# Patient Record
Sex: Male | Born: 1985 | Race: White | Hispanic: No | Marital: Single | Smoking: Never smoker
Health system: Southern US, Community
[De-identification: ages and names within clinical notes are randomized; demographics above are authoritative.]

## PROBLEM LIST (undated history)

## (undated) DIAGNOSIS — F32A Depression, unspecified: Secondary | ICD-10-CM

## (undated) DIAGNOSIS — F419 Anxiety disorder, unspecified: Secondary | ICD-10-CM

## (undated) DIAGNOSIS — F329 Major depressive disorder, single episode, unspecified: Secondary | ICD-10-CM

## (undated) HISTORY — DX: Major depressive disorder, single episode, unspecified: F32.9

## (undated) HISTORY — DX: Depression, unspecified: F32.A

## (undated) HISTORY — DX: Anxiety disorder, unspecified: F41.9

---

## 2013-12-29 ENCOUNTER — Ambulatory Visit (INDEPENDENT_AMBULATORY_CARE_PROVIDER_SITE_OTHER): Payer: BC Managed Care – PPO | Admitting: Family Medicine

## 2013-12-29 VITALS — BP 110/70 | HR 77 | Temp 98.1°F | Resp 16 | Ht 69.5 in | Wt 127.0 lb

## 2013-12-29 DIAGNOSIS — H00039 Abscess of eyelid unspecified eye, unspecified eyelid: Secondary | ICD-10-CM

## 2013-12-29 DIAGNOSIS — R21 Rash and other nonspecific skin eruption: Secondary | ICD-10-CM

## 2013-12-29 DIAGNOSIS — L03213 Periorbital cellulitis: Secondary | ICD-10-CM

## 2013-12-29 LAB — POCT CBC
GRANULOCYTE PERCENT: 53.6 % (ref 37–80)
HEMATOCRIT: 47.5 % (ref 43.5–53.7)
HEMOGLOBIN: 15.1 g/dL (ref 14.1–18.1)
LYMPH, POC: 3.1 (ref 0.6–3.4)
MCH, POC: 31.8 pg — AB (ref 27–31.2)
MCHC: 31.8 g/dL (ref 31.8–35.4)
MCV: 100 fL — AB (ref 80–97)
MID (cbc): 0.7 (ref 0–0.9)
MPV: 9.6 fL (ref 0–99.8)
POC Granulocyte: 4.4 (ref 2–6.9)
POC LYMPH PERCENT: 37.5 %L (ref 10–50)
POC MID %: 8.9 %M (ref 0–12)
Platelet Count, POC: 223 10*3/uL (ref 142–424)
RBC: 4.75 M/uL (ref 4.69–6.13)
RDW, POC: 13.3 %
WBC: 8.3 10*3/uL (ref 4.6–10.2)

## 2013-12-29 LAB — POCT SEDIMENTATION RATE: POCT SED RATE: 2 mm/h (ref 0–22)

## 2013-12-29 MED ORDER — VALACYCLOVIR HCL 1 G PO TABS: 1000.0000 mg | ORAL_TABLET | Freq: Three times a day (TID) | ORAL | Status: DC

## 2013-12-29 MED ORDER — CLINDAMYCIN HCL 300 MG PO CAPS: 300.0000 mg | ORAL_CAPSULE | Freq: Three times a day (TID) | ORAL | Status: DC

## 2013-12-29 NOTE — Progress Notes (Signed)
Subjective:    Patient ID: Gary Mason, male    DOB: 09/01/1986, 28 y.o.   MRN: [id number]  Chief Complaint  Patient presents with  . Sore Throat  . Fatigue  . Itchy Eye    Thinks has an infection to Lt Eye  x yesterday morning   HPI  This chart was scribed for Gary Mason by Smiley HousemanFallon Davis, Scribe. This patient was seen in room 3 and the patient's care was started at 1:02 PM.  HPI Comments: Gary Mason is a 28 y.o. male who presents to [facility] complaining of a rash and redness around his left eye that started yesterday morning.  He states he noticed it during his shower yesterday morning as it was painful when the water ran across the skin around his left eye.  He reports it subsided during the day yesterday, but woke up this morning with redness around the eye and an itchy sensation.  He denies pain to his actual eye, but has pain to the surrounding eyelid and skin area.  He denies any vision changes.    He also complains of a sore throat and fatigue.  He tried anti-itch cream and benadryl without relief.    He reports that he did have chicken pox as a child.    History reviewed. No pertinent past surgical history.  Family History  Problem Relation Age of Onset  . Cancer Paternal Grandmother   . Leukemia Paternal Grandfather     History   Social History  . Marital Status: Single    Spouse Name: N/A    Number of Children: N/A  . Years of Education: N/A   Occupational History  . Not on file.   Social History Main Topics  . Smoking status: Never Smoker   . Smokeless tobacco: Not on file  . Alcohol Use: No  . Drug Use: No  . Sexual Activity: No   Other Topics Concern  . Not on file   Social History Narrative  . No narrative on file    No Known Allergies  There are no active problems to display for this patient.   No results found for this or any previous visit.   Review of Systems  Constitutional: Positive for fatigue. Negative for  fever, chills, activity change and appetite change.  HENT: Positive for rhinorrhea and sore throat. Negative for congestion.   Eyes: Positive for pain (area surrounding left eye.) and itching. Negative for photophobia, discharge, redness and visual disturbance.  Respiratory: Negative for cough and shortness of breath.   Cardiovascular: Negative for chest pain.  Gastrointestinal: Negative for nausea, vomiting, abdominal pain and diarrhea.  Musculoskeletal: Negative for back pain.  Skin: Positive for color change and rash. Negative for wound.  Neurological: Negative for syncope.  Psychiatric/Behavioral: The patient is nervous/anxious.     Triage Vitals: BP 110/70  Pulse 77  Temp(Src) 98.1 F (36.7 C) (Oral)  Resp 16  Ht 5' 9.5" (1.765 m)  Wt 127 lb (57.607 kg)  BMI 18.49 kg/m2  SpO2 98% Objective:   Physical Exam  Nursing note and vitals reviewed. Constitutional: He is oriented to person, place, and time. He appears well-developed and well-nourished. No distress.  HENT:  Head: Normocephalic and atraumatic.  Right Ear: Tympanic membrane, external ear and ear canal normal.  Left Ear: Tympanic membrane, external ear and ear canal normal.  Nose: Mucosal edema and rhinorrhea present.  Mouth/Throat: Uvula is midline and mucous membranes are normal. No oropharyngeal  exudate, posterior oropharyngeal edema or posterior oropharyngeal erythema.  Eyes: Conjunctivae and EOM are normal. Pupils are equal, round, and reactive to light. Right eye exhibits no chemosis, no discharge and no exudate. Left eye exhibits no chemosis, no discharge and no exudate. Right conjunctiva is not injected. Right conjunctiva has no hemorrhage. Left conjunctiva is not injected. Left conjunctiva has no hemorrhage.  Fundoscopic exam:      The right eye shows no arteriolar narrowing, no AV nicking, no exudate, no hemorrhage and no papilledema.       The left eye shows no arteriolar narrowing, no AV nicking, no exudate, no  hemorrhage and no papilledema.  No pain or restrictions w/ eye movement No fluorescein uptake in Left eye - fluorescein stain reveals no corneal abrasions, ulcers, or other abnml  Neck: Neck supple. No tracheal deviation present.  Cardiovascular: Normal rate, regular rhythm and normal heart sounds.   No murmur heard. Pulmonary/Chest: Effort normal and breath sounds normal. No respiratory distress. He has no wheezes. He has no rales.  Musculoskeletal: Normal range of motion.  Neurological: He is alert and oriented to person, place, and time.  Skin: Skin is warm and dry. Rash noted. Rash is maculopapular.  Mildly erythematous poorly defined blanchable macule over left upper and lower eyelid.  Few papules extend over left cheek and beneath left ear. No lesions on posterior neck or on right half of face.  Psychiatric: He has a normal mood and affect. His behavior is normal.    Visual Acuity Screening   Right eye Left eye Both eyes  Without correction: 20/20 20/20 20/20   With correction:         Assessment & Plan:  Rash and nonspecific skin eruption - Plan: POCT CBC, POCT SEDIMENTATION RATE, Varicella zoster antibody, IgG, Varicella zoster antibody, IgM  Preseptal cellulitis of left eye - warm compresses of left eye freq.  Suspect preseptal cellulitis - no concerns for orbital involvement on exam but if worsens may need to cons CT scan to ensure no progression. warned of diarrhea w/ clinda - increase probiotics and good handwashing.  Will also cover w/ valtrex as cannot completely exclude shinges as there are some papules extending down the side of his face to left ear.  Recheck in 48 hrs w/ me. Work note given until then.  Meds ordered this encounter  Medications  . ALPRAZolam (XANAX) 1 MG tablet    Sig: Take 1 mg by mouth at bedtime as needed for anxiety.  . Vilazodone HCl (VIIBRYD) 40 MG TABS    Sig: Take by mouth daily.  . clindamycin (CLEOCIN) 300 MG capsule    Sig: Take 1 capsule (300  mg total) by mouth 3 (three) times daily.    Dispense:  30 capsule    Refill:  0  . valACYclovir (VALTREX) 1000 MG tablet    Sig: Take 1 tablet (1,000 mg total) by mouth 3 (three) times daily.    Dispense:  21 tablet    Refill:  0    I personally performed the services described in this documentation, which was scribed in my presence. The recorded information has been reviewed and considered, and addended by me as needed.  Norberto Sorenson, MD MPH

## 2013-12-29 NOTE — Patient Instructions (Signed)
Periorbital Cellulitis °Periorbital cellulitis is a common infection that can affect the eyelid and the soft tissues that surround the eyeball. The infection may also affect the structures that produce and drain tears. It does not affect the eyeball itself. Natural tissue barriers usually prevent the spread of this infection to the eyeball and other deeper areas of the eye socket.      °CAUSES °· Bacterial infection. °· Long-term (chronic) sinus infections. °· An object (foreign body) stuck behind the eye. °· An injury that goes through the eyelid tissues. °· An injury that causes an infection, such as an insect sting. °· Fracture of the bone around the eye. °· Infections which have spread from the eyelid or other structures around the eye. °· Bite wounds. °· Inflammation or infection of the lining membranes of the brain (meningitis). °· An infection in the blood (septicemia). °· Dental infection (abscess). °· Viral infection (this is rare). °SYMPTOMS °Symptoms usually come on suddenly. °· Pain in the eye. °· Red, hot, and swollen eyelids and possibly cheeks. The swelling is sometimes bad enough that the eyelids cannot open. Some infections make the eyelids look purple. °· Fever and feeling generally ill. °· Pain when touching the area around the eye. °DIAGNOSIS  °Periorbital cellulitis can be diagnosed from an eye exam. In severe cases, your caregiver might suggest: °· Blood tests. °· Imaging tests (such as a CT scan) to examine the sinuses and the area around and behind the eyeball. °TREATMENT °If your caregiver feels that you do not have any signs of serious infection, treatment may include: °· Antibiotics. °· Nasal decongestants to reduce swelling. °· Referral to a dentist if it is suspected that the infection was caused by a prior tooth infection. °· Examination every day to make sure the problem is improving. °HOME CARE INSTRUCTIONS °· Take your antibiotics as directed. Finish them even if you start to feel  better. °· Some pain is normal with this condition. Take pain medicine as directed by your caregiver. Only take pain medicines approved by your caregiver. °· It is important to drink fluids. Drink enough water and fluids to keep your urine clear or pale yellow. °· Do not smoke. °· Rest and get plenty of sleep. °· Mild or moderate fevers generally have no long-term effects and often do not require treatment. °· If your caregiver has given you a follow-up appointment, it is very important to keep that appointment. Your caregiver will need to make sure that the infection is getting better. It is important to check that a more serious infection is not developing. °SEEK IMMEDIATE MEDICAL CARE IF: °· Your eyelids become more painful, red, warm, or swollen. °· You develop double vision or your vision becomes blurred or worsens in any way. °· You have trouble moving your eyes. °· The eye looks like it is popping out (proptosis). °· You develop a severe headache, severe neck pain, or neck stiffness. °· You develop repeated vomiting. °· You have a fever or persistent symptoms for more than 72 hours. °· You have a fever and your symptoms suddenly get worse. °MAKE SURE YOU: °· Understand these instructions. °· Will watch your condition. °· Will get help right away if you are not doing well or get worse. °Document Released: 01/10/2011 Document Revised: 03/01/2012 Document Reviewed: 01/10/2011 °[employer]® Patient Information ©2014 [employer]. ° °

## 2013-12-30 LAB — VARICELLA ZOSTER ANTIBODY, IGM: Varicella Zoster Ab IgM: 0.07 {ISR} (ref <0.91)

## 2013-12-30 LAB — VARICELLA ZOSTER ANTIBODY, IGG: VARICELLA IGG: 1454 {index} — AB (ref <135.00)

## 2014-01-09 ENCOUNTER — Encounter: Payer: Self-pay | Admitting: Family Medicine

## 2016-01-05 ENCOUNTER — Ambulatory Visit (INDEPENDENT_AMBULATORY_CARE_PROVIDER_SITE_OTHER): Payer: BLUE CROSS/BLUE SHIELD | Admitting: Physician Assistant

## 2016-01-05 VITALS — BP 112/70 | HR 71 | Temp 97.9°F | Resp 16 | Ht 70.5 in | Wt 127.8 lb

## 2016-01-05 DIAGNOSIS — F410 Panic disorder [episodic paroxysmal anxiety] without agoraphobia: Secondary | ICD-10-CM

## 2016-01-05 LAB — POCT URINALYSIS DIP (MANUAL ENTRY)
BILIRUBIN UA: NEGATIVE
Blood, UA: NEGATIVE
Glucose, UA: NEGATIVE
LEUKOCYTES UA: NEGATIVE
NITRITE UA: NEGATIVE
PH UA: 6.5
Protein Ur, POC: NEGATIVE
Spec Grav, UA: 1.015
Urobilinogen, UA: 0.2

## 2016-01-05 NOTE — Progress Notes (Signed)
01/06/2016 9:31 AM   DOB: 12-28-1985 / MRN: [id number]  SUBJECTIVE:  Gary Mason is a 30 y.o. male presenting for increasing anxiety from driving at night.  Reports that with it getting darker sooner he has increasing fear with driving at night.  Reports he takes a 0.5 mg Xanax before driving and has to call his mother while driving home.  This has become so disturbing to him that he sleeps 2-3 hours per night and then awakens worrying about the next time he will need to drive at night.  He is seen by a psychiatrist, and was unable to get an appointment with him today.  He taking Vibryd 40 mg and has been taking this for roughly 1 year without any problems.  He has a psychotherapist but has not seen her in a year.  He has a history of depression.  No hospitalizations.  Negative for SI/HI and has never felt this way. Feels that he has no appetite and is forcing himself to eat a 1/2 sandwich.  He has No Known Allergies.   He  has a past medical history of Anxiety and Depression.    He  reports that he has never smoked. He does not have any smokeless tobacco history on file. He reports that he does not drink alcohol or use illicit drugs. He  reports that he does not engage in sexual activity. The patient  has no past surgical history on file.  His family history includes Cancer in his paternal grandmother; Leukemia in his paternal grandfather.  Review of Systems  Constitutional: Negative for fever and chills.  Eyes: Negative for blurred vision.  Respiratory: Negative for cough and shortness of breath.   Cardiovascular: Negative for chest pain.  Gastrointestinal: Negative for nausea and abdominal pain.  Genitourinary: Negative for dysuria, urgency and frequency.  Musculoskeletal: Negative for myalgias.  Skin: Negative for rash.  Neurological: Negative for dizziness, tingling and headaches.  Psychiatric/Behavioral: Positive for depression. Negative for suicidal ideas, hallucinations, memory loss  and substance abuse. The patient has insomnia. The patient is not nervous/anxious.     Problem list and medications reviewed and updated by myself where necessary, and exist elsewhere in the encounter.   OBJECTIVE:  BP 112/70 mmHg  Pulse 71  Temp(Src) 97.9 F (36.6 C) (Oral)  Resp 16  Ht 5' 10.5" (1.791 m)  Wt 127 lb 12.8 oz (57.97 kg)  BMI 18.07 kg/m2  SpO2 98%  Physical Exam  Constitutional: He is oriented to person, place, and time. He appears well-developed. He does not appear ill.  HENT:  Right Ear: External ear normal.  Left Ear: External ear normal.  Nose: Nose normal.  Mouth/Throat: Oropharynx is clear and moist. No oropharyngeal exudate.  Eyes: Conjunctivae and EOM are normal. Pupils are equal, round, and reactive to light.  Neck: Normal range of motion. Neck supple. No thyromegaly present.  Cardiovascular: Normal rate, regular rhythm and normal heart sounds.   Pulmonary/Chest: Effort normal.  Abdominal: He exhibits no distension.  Musculoskeletal: Normal range of motion.  Lymphadenopathy:    He has no cervical adenopathy.  Neurological: He is alert and oriented to person, place, and time. He has normal reflexes. He displays no atrophy, no tremor and normal reflexes. No cranial nerve deficit or sensory deficit. He exhibits normal muscle tone. He displays no seizure activity. Coordination and gait normal. GCS eye subscore is 4. GCS verbal subscore is 5. GCS motor subscore is 6.  Skin: Skin is warm and dry. He  is not diaphoretic.  Psychiatric: He has a normal mood and affect.  Nursing note and vitals reviewed.   Results for orders placed or performed in visit on 01/05/16 (from the past 48 hour(s))  POCT urinalysis dipstick     Status: Abnormal   Collection Time: 01/05/16  3:43 PM  Result Value Ref Range   Color, UA yellow yellow   Clarity, UA clear clear   Glucose, UA negative negative   Bilirubin, UA negative negative   Ketones, POC UA trace (5) (A) negative    Spec Grav, UA 1.015    Blood, UA negative negative   pH, UA 6.5    Protein Ur, POC negative negative   Urobilinogen, UA 0.2    Nitrite, UA Negative Negative   Leukocytes, UA Negative Negative    ASSESSMENT AND PLAN  [name] was seen today for fatigue, chills, anxiety and depression.  Diagnoses and all orders for this visit:  Severe anxiety with panic: He has a significant psychiatric burden and he seems to have some obsessive tendencies with regard to night time driving.  Advised that he return to Dr. Mila HomerSena as soon as possible and try to coordinate with his psychologist.  I have composed a note asking that he be able to not have to during darkness. Advised that he purchase some protein shakes.     -     POCT urinalysis dipstick    The patient was advised to call or return to clinic if he does not see an improvement in symptoms or to seek the care of the closest emergency department if he worsens with the above plan.   Deliah BostonMichael Clark, MHS, PA-C [facility] 01/06/2016 9:31 AM

## 2016-02-18 ENCOUNTER — Telehealth: Payer: Self-pay

## 2016-02-18 NOTE — Telephone Encounter (Signed)
Pt states he had seen Deliah Boston and would like to speak with him regarding his diagnosis. Please call [phone]

## 2016-02-19 NOTE — Telephone Encounter (Signed)
I can not excuse him from jury duty. If he needs to see me he will need to return to clinic.  Deliah Boston, MS, PA-C 12:34 PM, 02/19/2016

## 2016-02-19 NOTE — Telephone Encounter (Signed)
Pt states he would like a note excusing him from jury duty and also would like to speak with Deliah Boston about some other issues before he come to see him Please call [phone]

## 2016-02-19 NOTE — Telephone Encounter (Signed)
Left message to RTC.

## 2018-02-04 ENCOUNTER — Emergency Department (HOSPITAL_COMMUNITY): Payer: Worker's Compensation

## 2018-02-04 ENCOUNTER — Emergency Department (HOSPITAL_COMMUNITY)
Admission: EM | Admit: 2018-02-04 | Discharge: 2018-02-04 | Disposition: A | Discharge status: Home / Self Care | Payer: Worker's Compensation | Attending: Emergency Medicine | Admitting: Emergency Medicine

## 2018-02-04 ENCOUNTER — Encounter (HOSPITAL_COMMUNITY): Payer: Self-pay | Admitting: Emergency Medicine

## 2018-02-04 DIAGNOSIS — Y999 Unspecified external cause status: Secondary | ICD-10-CM | POA: Insufficient documentation

## 2018-02-04 DIAGNOSIS — W293XXA Contact with powered garden and outdoor hand tools and machinery, initial encounter: Secondary | ICD-10-CM | POA: Insufficient documentation

## 2018-02-04 DIAGNOSIS — Y929 Unspecified place or not applicable: Secondary | ICD-10-CM | POA: Insufficient documentation

## 2018-02-04 DIAGNOSIS — Y939 Activity, unspecified: Secondary | ICD-10-CM | POA: Diagnosis not present

## 2018-02-04 DIAGNOSIS — S81812A Laceration without foreign body, left lower leg, initial encounter: Secondary | ICD-10-CM | POA: Insufficient documentation

## 2018-02-04 MED ORDER — LIDOCAINE-EPINEPHRINE (PF) 2 %-1:200000 IJ SOLN
20.0000 mL | Freq: Once | INTRAMUSCULAR | Status: AC
  Administered 2018-02-04: 20 mL via INTRADERMAL
  Filled 2018-02-04: qty 20

## 2018-02-04 MED ORDER — LIDOCAINE-EPINEPHRINE (PF) 2 %-1:200000 IJ SOLN
20.0000 mL | Freq: Once | INTRAMUSCULAR | Status: DC
  Filled 2018-02-04: qty 20

## 2018-02-04 MED ORDER — IBUPROFEN 600 MG PO TABS: 600.0000 mg | ORAL_TABLET | Freq: Three times a day (TID) | ORAL | 0 refills | Status: DC | PRN

## 2018-02-04 MED ORDER — TETANUS-DIPHTH-ACELL PERTUSSIS 5-2.5-18.5 LF-MCG/0.5 IM SUSP
0.5000 mL | Freq: Once | INTRAMUSCULAR | Status: AC
  Administered 2018-02-04: 0.5 mL via INTRAMUSCULAR
  Filled 2018-02-04: qty 0.5

## 2018-02-04 MED ORDER — CEFAZOLIN SODIUM-DEXTROSE 1-4 GM/50ML-% IV SOLN
1.0000 g | Freq: Once | INTRAVENOUS | Status: AC
  Administered 2018-02-04: 1 g via INTRAVENOUS
  Filled 2018-02-04: qty 50

## 2018-02-04 MED ORDER — HYDROCODONE-ACETAMINOPHEN 5-325 MG PO TABS
2.0000 | ORAL_TABLET | Freq: Once | ORAL | Status: AC
  Administered 2018-02-04: 2 via ORAL
  Filled 2018-02-04: qty 2

## 2018-02-04 MED ORDER — CEPHALEXIN 500 MG PO CAPS: 500.0000 mg | ORAL_CAPSULE | Freq: Four times a day (QID) | ORAL | 0 refills | Status: DC

## 2018-02-04 NOTE — Discharge Instructions (Addendum)
FOLLOW UP WITH PRIMARY CARE PROVIDER, URGENT CARE OR EMERGENCY DEPARTMENT NEXT 10-12 DAYS REEVALUATION.  START KEFLEX X 10 DAYS.  OVER THE COUNTER IBUPROFEN FOR PAIN.

## 2018-02-04 NOTE — ED Triage Notes (Signed)
Pt preents to ED with large laceration to left shin from chainsaw.  Tourniquet applied in field by coworkers, bleeding controlled, underlying bone noted

## 2018-02-04 NOTE — Progress Notes (Signed)
Orthopedic Tech Progress Note Patient Details:  Gary CongoColin Mason 04/18/1986 [id number]  Ortho Devices Type of Ortho Device: Crutches, Knee Immobilizer Ortho Device/Splint Interventions: Application   Post Interventions Patient Tolerated: Well Instructions Provided: Care of device   [name] 02/04/2018, 6:27 PM

## 2018-02-04 NOTE — ED Provider Notes (Signed)
[facility] EMERGENCY DEPARTMENT Provider Note   CSN: [id number] Arrival date & time: 02/04/18  1512     History   Chief Complaint Chief Complaint  Patient presents with  . Laceration    HPI Gary Mason is a 32 y.o. male.  HPI  32 year old male presents emergency department status post chainsaw accident that occurred at 1430 today.  Wound to LLE with makeshift tourniquet applied.  Patient states negative paresthesia, paralysis muscular weakness.  Patient states tetanus is not up-to-date.  Denies any additional injuries.  Past Medical History:  Diagnosis Date  . Anxiety   . Depression     There are no active problems to display for this patient.   History reviewed. No pertinent surgical history.     Home Medications    Prior to Admission medications   Medication Sig Start Date End Date Taking? Authorizing Provider  ALPRAZolam Prudy Feeler) 1 MG tablet Take 1 mg by mouth at bedtime as needed for anxiety.    [provider]  cephALEXin (KEFLEX) 500 MG capsule Take 1 capsule (500 mg total) by mouth 4 (four) times daily. 02/04/18   Jaynie Collins, DO  valACYclovir (VALTREX) 1000 MG tablet Take 1 tablet (1,000 mg total) by mouth 3 (three) times daily. Patient not taking: Reported on 01/05/2016 12/29/13   Sherren Mocha, MD  Vilazodone HCl (VIIBRYD) 40 MG TABS Take by mouth daily.    [provider]    Family History Family History  Problem Relation Age of Onset  . Cancer Paternal Grandmother   . Leukemia Paternal Grandfather     Social History Social History   Tobacco Use  . Smoking status: Never Smoker  . Smokeless tobacco: Never Used  Substance Use Topics  . Alcohol use: No  . Drug use: No     Allergies   Patient has no known allergies.   Review of Systems Review of Systems  Review of Systems  Constitutional: Negative for fever and chills.  HENT: Negative for ear pain, sore throat and trouble swallowing.   Eyes: Negative  for pain and visual disturbance.  Respiratory: Negative for cough and shortness of breath.   Cardiovascular: Negative for chest pain and leg swelling.  Gastrointestinal: Negative for nausea, vomiting, abdominal pain and diarrhea.  Genitourinary: Negative for dysuria, urgency and frequency.  Musculoskeletal: see HPI Skin: see HPI Neurological: Negative for dizziness, syncope, speech difficulty, weakness and numbness.   Physical Exam Updated Vital Signs BP (!) 140/97 (BP Location: Right Arm)   Pulse 87   Temp 97.6 F (36.4 C) (Oral)   Resp 18   SpO2 99%   Physical Exam  Physical Exam Vitals:   02/04/18 1519  BP: (!) 140/97  Pulse: 87  Resp: 18  Temp: 97.6 F (36.4 C)  SpO2: 99%   Constitutional: Patient is in no acute distress Head: Normocephalic and atraumatic.  Eyes: Extraocular motion intact, no scleral icterus Neck: Supple without meningismus, mass, or overt JVD Respiratory: Effort normal and breath sounds normal. No respiratory distress. CV: Heart regular rate and rhythm, no obvious murmurs.  Pulses +2 and symmetric Abdomen: Soft, non-tender, non-distended MSK: LLE: fAROM LLE pulse 2+ DP, NVI/sensory intact; neg involvement of L knee jt capsule.  Skin: 10 cm laceration L distal upper leg minimal muscle belly involvement.;  Neuro: Alert and oriented, no motor deficit noted Psychiatric: Mood and affect are normal.  ED Treatments / Results  Labs (all labs ordered are listed, but only abnormal results are displayed)  Labs Reviewed - No data to display  EKG  EKG Interpretation None       Radiology Dg Femur Portable Min 2 Views Left  Result Date: 02/04/2018 CLINICAL DATA:  Left leg laceration from chainsaw. EXAM: LEFT FEMUR PORTABLE 2 VIEWS COMPARISON:  None. FINDINGS: Skin defect overlapping the patella. No underlying fracture or opaque foreign body. No detected joint effusion. There is overlying bandaging. IMPRESSION: Large laceration near the patella. No  visible fracture or opaque foreign body. Electronically Signed   By: Marnee Spring M.D.   On: 02/04/2018 16:38    Procedures .Marland KitchenLaceration Repair Date/Time: 02/04/2018 6:01 PM Performed by: Jaynie Collins, DO Authorized by: Jaynie Collins, DO   Consent:    Consent obtained:  Verbal   Risks discussed:  Infection, pain, retained foreign body, tendon damage, poor cosmetic result, need for additional repair, nerve damage, poor wound healing and vascular damage Anesthesia (see MAR for exact dosages):    Anesthesia method:  Local infiltration   Local anesthetic:  Lidocaine 2% WITH epi (20 ml) Laceration details:    Location:  Leg (10 cm ) Repair type:    Repair type:  Complex Pre-procedure details:    Preparation:  Patient was prepped and draped in usual sterile fashion and imaging obtained to evaluate for foreign bodies Exploration:    Hemostasis achieved with:  Direct pressure   Wound exploration: wound explored through full range of motion and entire depth of wound probed and visualized     Wound extent: muscle damage     Wound extent: no nerve damage noted, no tendon damage noted and no vascular damage noted   Treatment:    Area cleansed with:  Saline   Amount of cleaning:  Extensive   Irrigation solution:  Sterile saline   Irrigation volume:  500 ml   Irrigation method:  Syringe   Visualized foreign bodies/material removed: no     Debridement:  None   Undermining:  None   Scar revision: no   Skin repair:    Repair method:  Sutures   Suture size:  4-0   Suture material:  Prolene   Suture technique:  Horizontal mattress (9) Approximation:    Approximation:  Close   Vermilion border: well-aligned   Post-procedure details:    Dressing:  Antibiotic ointment and sterile dressing   Patient tolerance of procedure:  Tolerated well, no immediate complications Comments:     10 SQ vicryl 3-0 sutures applied.     (including critical care time)  Medications Ordered in  ED Medications  lidocaine-EPINEPHrine (XYLOCAINE W/EPI) 2 %-1:200000 (PF) injection 20 mL (not administered)  ceFAZolin (ANCEF) IVPB 1 g/50 mL premix (0 g Intravenous Stopped 02/04/18 1749)  Tdap (BOOSTRIX) injection 0.5 mL (0.5 mLs Intramuscular Given 02/04/18 1614)  lidocaine-EPINEPHrine (XYLOCAINE W/EPI) 2 %-1:200000 (PF) injection 20 mL (20 mLs Intradermal Given 02/04/18 1614)  HYDROcodone-acetaminophen (NORCO/VICODIN) 5-325 MG per tablet 2 tablet (2 tablets Oral Given 02/04/18 1613)     Initial Impression / Assessment and Plan / ED Course  I have reviewed the triage vital signs and the nursing notes.  Pertinent labs & imaging results that were available during my care of the patient were reviewed by me and considered in my medical decision making (see chart for details).     32 year old male presents emergency department status post chainsaw accident that occurred at 1430 today.  Wound to LLE with makeshift tourniquet applied.  Patient states negative paresthesia, paralysis muscular weakness.  Patient states tetanus is not  up-to-date.  Denies any additional injuries.  Physical exam as annotated above appears to be a superficial wound without functional deficit.  Negative vascular involvement.  Tetanus updated emergency department.  X-ray of left lower extremity without evidence of findings concerning for fracture/dislocation/bone involvement/FOB.  Joint capsule is seen without evidence of involvement.   1gm ancef given in ED.  Plan for d/c home crutches/immobilizer, po keflex x 10d w/ follow up PCP reevaluation wound.  Suture removal 10-12d.    Final Clinical Impressions(s) / ED Diagnoses   Final diagnoses:  Laceration of left lower extremity, initial encounter    ED Discharge Orders        Ordered    cephALEXin (KEFLEX) 500 MG capsule  4 times daily     02/04/18 1752       Jaynie Collinsugustin, Nosson Wender, DO 02/04/18 1824    Charlynne PanderYao, David Hsienta, MD 02/04/18 2337

## 2018-02-14 ENCOUNTER — Ambulatory Visit (HOSPITAL_COMMUNITY)
Admission: EM | Admit: 2018-02-14 | Discharge: 2018-02-14 | Disposition: A | Discharge status: Home / Self Care | Payer: Worker's Compensation

## 2018-02-14 NOTE — ED Triage Notes (Signed)
Pt here for suture removal to his LUE.  The wound is clean, dry, intact, and properly approximated.  There is no drainage from the wound, no swelling, no redness and no odor.  Pt states the pain is significantly better and he denies any issues.  9 sutures were removed from the wound using a suture removal kit and pt was in NAD upon departure.

## 2019-09-01 ENCOUNTER — Other Ambulatory Visit: Payer: Self-pay

## 2019-09-01 DIAGNOSIS — Z20822 Contact with and (suspected) exposure to covid-19: Secondary | ICD-10-CM

## 2019-09-03 LAB — NOVEL CORONAVIRUS, NAA: SARS-CoV-2, NAA: NOT DETECTED

## 2019-10-09 IMAGING — DX DG FEMUR 2+V PORT*L*
4 series · 4 of 4 positions shown · non-contrast
Comparison: None.

CLINICAL DATA: Left leg laceration from chainsaw.

EXAM:
LEFT FEMUR PORTABLE 2 VIEWS

[femur ap (1 of 2)]
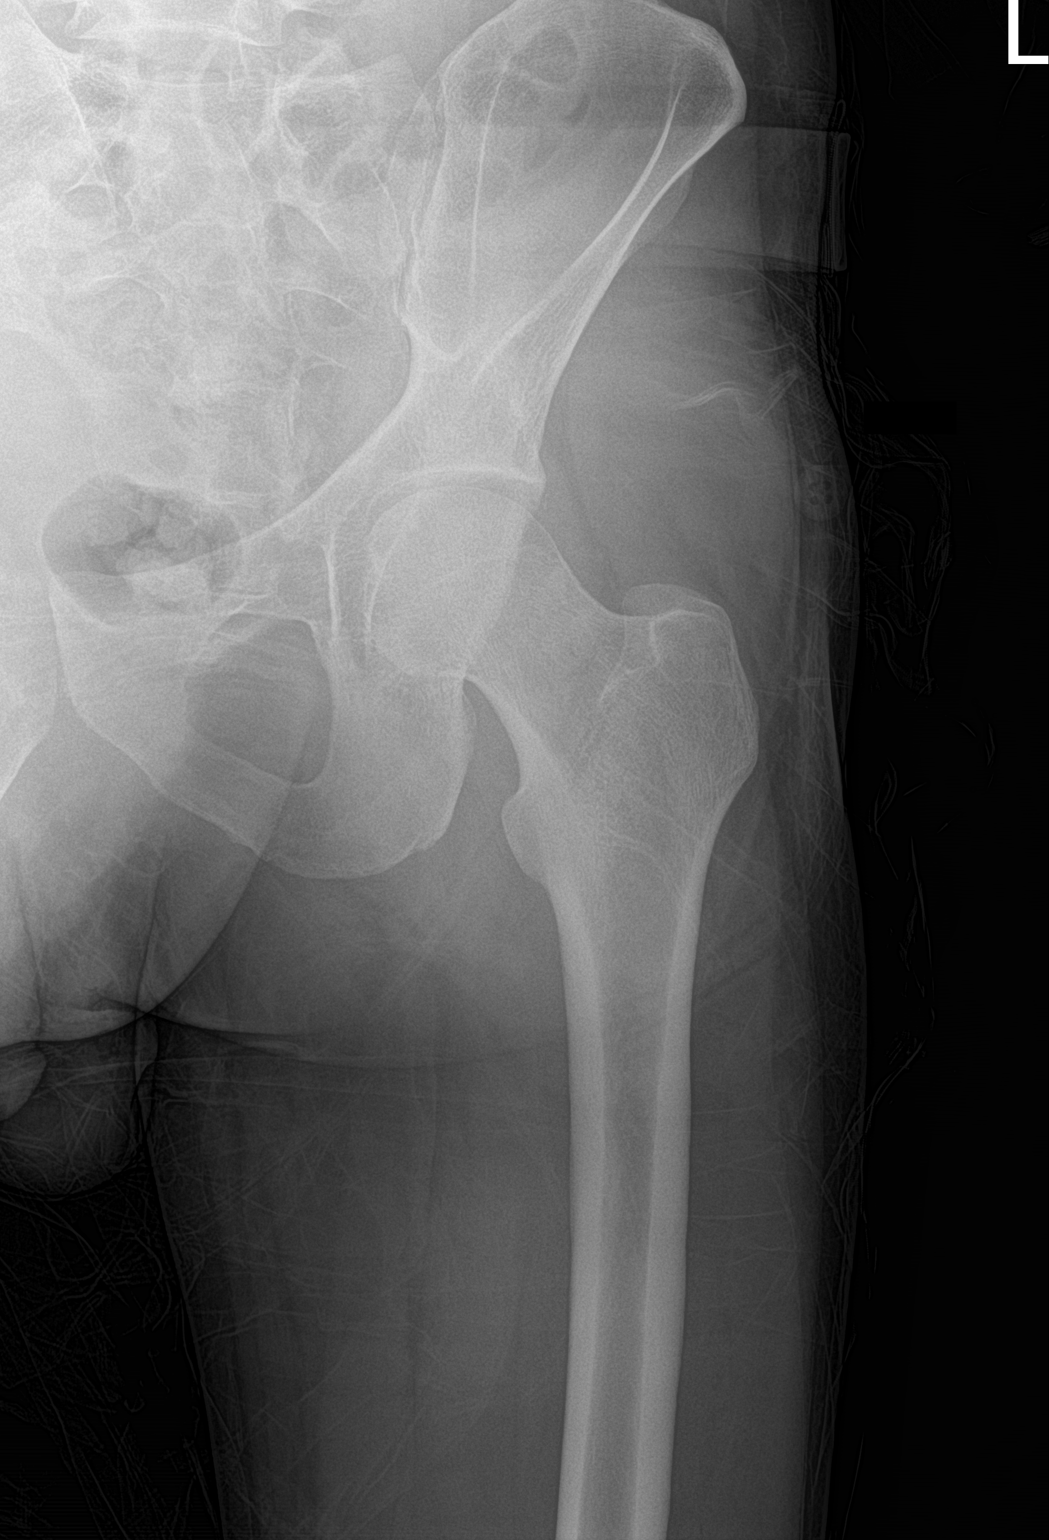

[femur ap (2 of 2)]
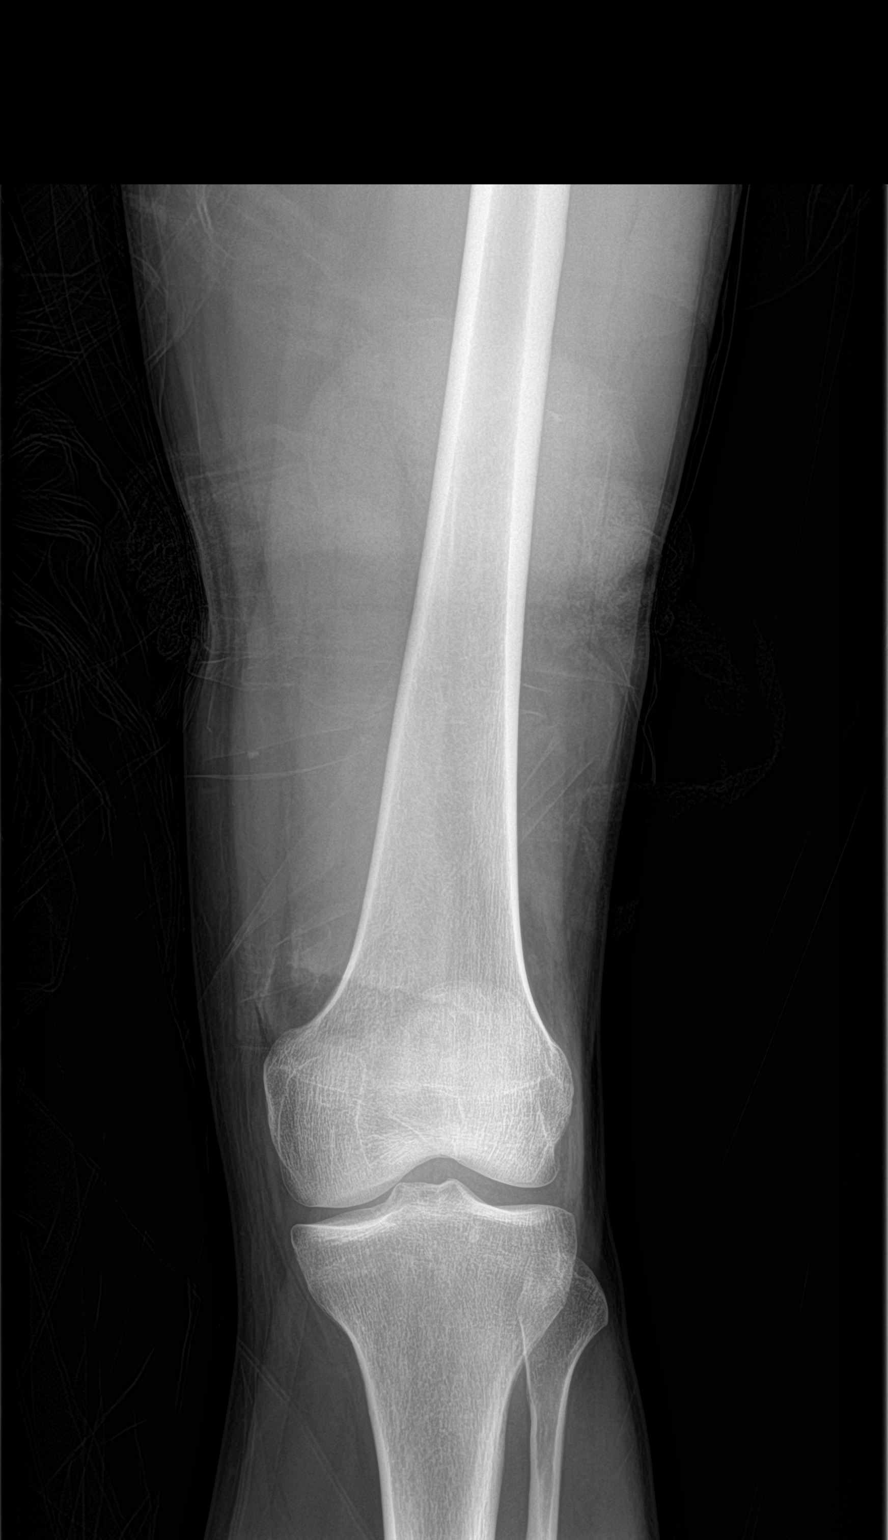

[femur lat (1 of 2)]
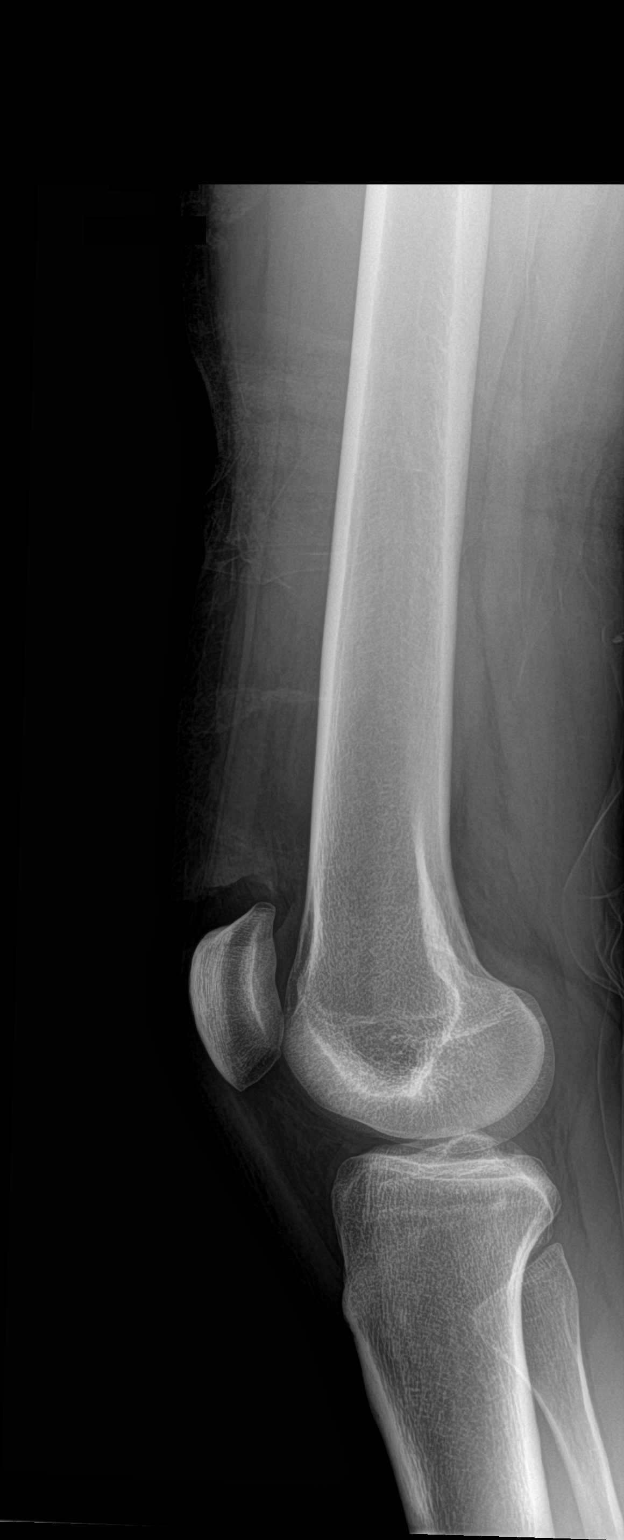

[femur lat (2 of 2)]
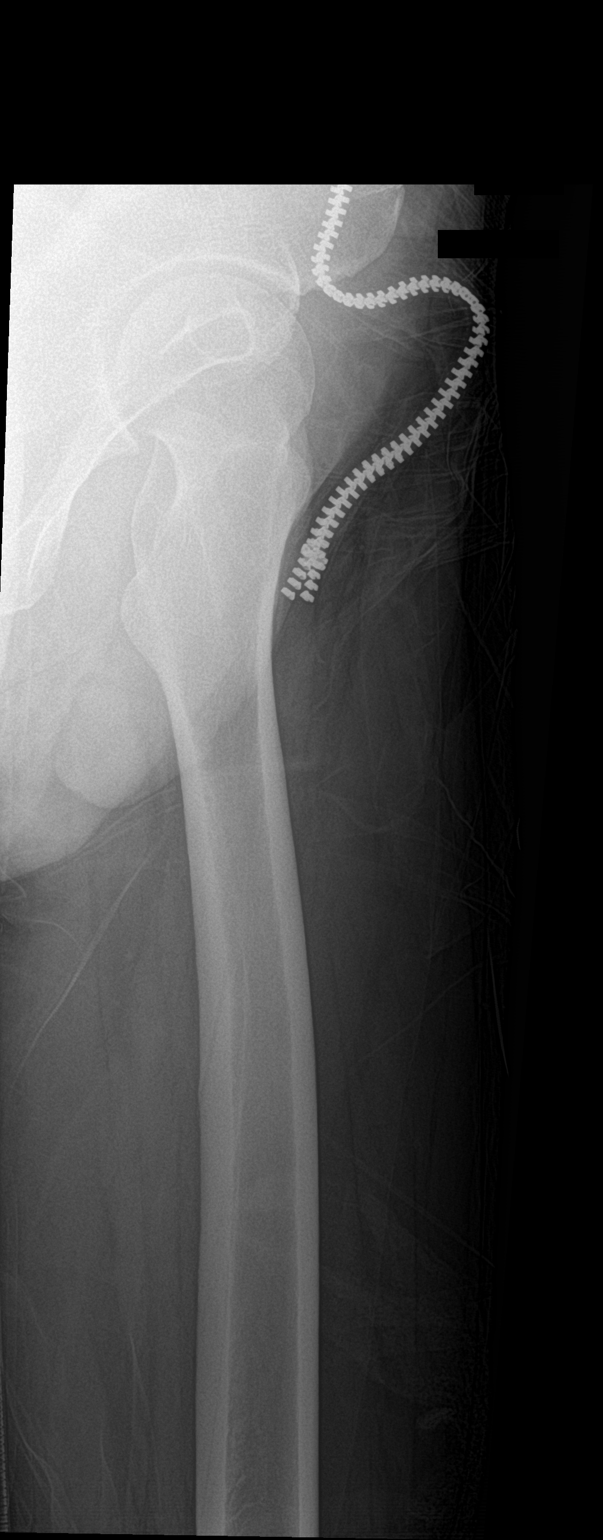

[4 of 4 positions shown; findings below may reference images not displayed]

FINDINGS: Skin defect overlapping the patella. No underlying fracture or
opaque foreign body. No detected joint effusion. There is overlying
bandaging.
IMPRESSION: Large laceration near the patella. No visible fracture or opaque
foreign body.

## 2022-04-24 ENCOUNTER — Encounter: Payer: Self-pay | Admitting: Nurse Practitioner

## 2022-04-24 ENCOUNTER — Ambulatory Visit (INDEPENDENT_AMBULATORY_CARE_PROVIDER_SITE_OTHER): Payer: Self-pay | Admitting: Nurse Practitioner

## 2022-04-24 VITALS — BP 106/60 | HR 62 | Temp 98.6°F | Ht 70.47 in | Wt 140.1 lb

## 2022-04-24 DIAGNOSIS — F331 Major depressive disorder, recurrent, moderate: Secondary | ICD-10-CM

## 2022-04-24 DIAGNOSIS — Z7689 Persons encountering health services in other specified circumstances: Secondary | ICD-10-CM

## 2022-04-24 DIAGNOSIS — F411 Generalized anxiety disorder: Secondary | ICD-10-CM

## 2022-04-24 NOTE — Progress Notes (Signed)
? ?New Patient Office Visit ? ?Subjective   ? ?Patient ID: Gary Mason, male    DOB: Aug 11, 1986  Age: 36 y.o. MRN: [id number] ? ?CC:  ?Chief Complaint  ?Patient presents with  ? New Patient (Initial Visit)  ? ? ?HPI ?Gary Mason presents to establish care ?The patent is coming from another local practice which has recently closed.  ?-he does have history of anxiety and panic attacks. Has been going on for the past 8 to 10 years, though patient states that he probably has had symptoms like this since he was a child.  ?-being around crowds makes this worse. States that drinving in the dark and driving on highways makes the panic attacks worse.  ?--he is on zoloft 200mg  daily. He also takes alprazolam 1 mg up to three times daily as needed for panic attacks. He states that this does not make him sleepy or cause negative side effects. He states that this medication effectively treats his anxiety and panic attacks.  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 It also outlines his  treatment from the start through his last visit with his previous primary care provider.  ? ?Outpatient Encounter Medications as of 04/24/2022  ?Medication Sig  ? ALPRAZolam (XANAX) 1 MG tablet Take 1 mg by mouth 3 (three) times daily as needed for anxiety.  ? sertraline (ZOLOFT) 100 MG tablet Take 200 mg by mouth daily.  ? [DISCONTINUED] cephALEXin (KEFLEX) 500 MG capsule Take 1 capsule (500 mg total) by mouth 4 (four) times daily. (Patient not taking: Reported on 04/24/2022)  ? [DISCONTINUED] hydrOXYzine (ATARAX) 25 MG tablet hydroxyzine HCl 25 mg tablet ? Take 1 tablet 3 times a day by oral route as needed.  ? [DISCONTINUED] ibuprofen (ADVIL,MOTRIN) 600 MG tablet Take 1 tablet (600 mg total) by mouth every 8 (eight) hours as needed. (Patient not taking: Reported on 04/24/2022)  ? [DISCONTINUED] valACYclovir (VALTREX) 1000 MG tablet Take 1 tablet (1,000 mg total)  by mouth 3 (three) times daily. (Patient not taking: Reported on 01/05/2016)  ? [DISCONTINUED] Vilazodone HCl (VIIBRYD) 40 MG TABS Take by mouth daily.  ? ?No facility-administered encounter medications on file as of 04/24/2022.  ? ? ?Past Medical History:  ?Diagnosis Date  ? Anxiety   ? Depression   ? ? ?History reviewed. No pertinent surgical history. ? ?Family History  ?Problem Relation Age of Onset  ? Cancer Paternal Grandmother   ? Leukemia Paternal Grandfather   ? ? ?Social History  ? ?Socioeconomic History  ? Marital status: Single  ?  Spouse name: Not on file  ? Number of children: Not on file  ? Years of education: Not on file  ? Highest education level: Not on file  ?Occupational History  ? Not on file  ?Tobacco Use  ? Smoking status: Never  ? Smokeless tobacco: Never  ?Substance and Sexual Activity  ? Alcohol use: No  ? Drug use: No  ? Sexual activity: Never  ?Other Topics Concern  ? Not on file  ?Social History Narrative  ? Not on file  ? ?Social Determinants of Health  ? ?Financial Resource Strain: Not on file  ?Food Insecurity: Not on file  ?Transportation Needs: Not on file  ?Physical Activity: Not on file  ?Stress: Not on file  ?Social Connections: Not on file  ?Intimate Partner Violence: Not on file  ? ? ?Review of Systems  ?Constitutional:  Negative for chills, fever and  malaise/fatigue.  ?HENT:  Negative for congestion, sinus pain and sore throat.   ?Eyes: Negative.   ?Respiratory:  Negative for cough, shortness of breath and wheezing.   ?Cardiovascular:  Negative for chest pain, palpitations and leg swelling.  ?Gastrointestinal:  Negative for constipation, diarrhea, nausea and vomiting.  ?Genitourinary: Negative.   ?Musculoskeletal:  Negative for myalgias.  ?Skin: Negative.   ?Neurological:  Negative for dizziness and headaches.  ?Endo/Heme/Allergies:  Does not bruise/bleed easily.  ?Psychiatric/Behavioral:  Positive for depression. The patient is nervous/anxious.   ? ?  ? ? ?Objective   ? ?Today's  Vitals  ? 04/24/22 1343  ?BP: 106/60  ?Pulse: 62  ?Temp: 98.6 ?F (37 ?C)  ?SpO2: 97%  ?Weight: 140 lb 1.9 oz (63.6 kg)  ?Height: 5' 10.47" (1.79 m)  ? ?Body mass index is 19.84 kg/m?.  ? ?Physical Exam ?Vitals and nursing note reviewed.  ?Constitutional:   ?   Appearance: Normal appearance. He is well-developed.  ?HENT:  ?   Head: Normocephalic and atraumatic.  ?Eyes:  ?   Pupils: Pupils are equal, round, and reactive to light.  ?Cardiovascular:  ?   Rate and Rhythm: Normal rate and regular rhythm.  ?   Pulses: Normal pulses.  ?   Heart sounds: Normal heart sounds.  ?Pulmonary:  ?   Effort: Pulmonary effort is normal.  ?   Breath sounds: Normal breath sounds.  ?Abdominal:  ?   Palpations: Abdomen is soft.  ?Musculoskeletal:     ?   General: Normal range of motion.  ?   Cervical back: Normal range of motion and neck supple.  ?Lymphadenopathy:  ?   Cervical: No cervical adenopathy.  ?Skin: ?   General: Skin is warm and dry.  ?   Capillary Refill: Capillary refill takes less than 2 seconds.  ?Neurological:  ?   General: No focal deficit present.  ?   Mental Status: He is alert and oriented to person, place, and time.  ?Psychiatric:     ?   Attention and Perception: Attention and perception normal.     ?   Mood and Affect: Affect normal. Mood is anxious.     ?   Speech: Speech normal.     ?   Behavior: Behavior normal. Behavior is cooperative.     ?   Thought Content: Thought content normal.     ?   Cognition and Memory: Cognition and memory normal.     ?   Judgment: Judgment normal.  ? ? ? ?  ? ?Assessment & Plan:  ?1. Generalized anxiety disorder ?Patient currently on sertraline 200 mg daily and xanax 1 mg up to three times daily as needed for acute anxiety. He has brought records of formal diagnosis with him to be reviewed.  ? ?2. Depression, major, recurrent, moderate ([place]) ?Patient currently on sertraline 200 mg daily and xanax 1 mg up to three times daily as needed for acute anxiety. He has brought records of  formal diagnosis with him to be reviewed.  ? ?3. Encounter to establish care ?Appointment today to establish new primary care provider. Will review patient records and update patient chart.  ?  ?Problem List Items Addressed This Visit   ? ?  ? Other  ? Generalized anxiety disorder - Primary  ? Relevant Medications  ? sertraline (ZOLOFT) 100 MG tablet  ? Depression, major, recurrent, moderate ([place])  ? Relevant Medications  ? sertraline (ZOLOFT) 100 MG tablet  ? ?Other Visit Diagnoses   ? ?  Encounter to establish care      ? ?  ? ? ?Return in about 6 weeks (around 06/05/2022) for health maintenance exam, mood.  ? ?Ronnell Freshwater, NP ? ? ?

## 2022-05-04 DIAGNOSIS — F331 Major depressive disorder, recurrent, moderate: Secondary | ICD-10-CM | POA: Insufficient documentation

## 2022-05-04 DIAGNOSIS — F411 Generalized anxiety disorder: Secondary | ICD-10-CM | POA: Insufficient documentation

## 2022-06-05 ENCOUNTER — Encounter: Payer: Self-pay | Admitting: Nurse Practitioner

## 2022-06-05 ENCOUNTER — Ambulatory Visit (INDEPENDENT_AMBULATORY_CARE_PROVIDER_SITE_OTHER): Payer: Self-pay | Admitting: Nurse Practitioner

## 2022-06-05 VITALS — BP 117/76 | HR 74 | Temp 98.6°F | Ht 70.47 in | Wt 140.4 lb

## 2022-06-05 DIAGNOSIS — F411 Generalized anxiety disorder: Secondary | ICD-10-CM

## 2022-06-05 DIAGNOSIS — F40248 Other situational type phobia: Secondary | ICD-10-CM

## 2022-06-05 MED ORDER — ALPRAZOLAM 1 MG PO TABS: 1.0000 mg | ORAL_TABLET | Freq: Three times a day (TID) | ORAL | 0 refills | Status: AC | PRN

## 2022-06-05 NOTE — Progress Notes (Deleted)
Established patient visit   Patient: Gary Mason   DOB: 1986/04/11   36 y.o. Male  MRN: [id number] Visit Date: 06/05/2022   No chief complaint on file.  Subjective    HPI  Annual physical  -treated for severe anxiety. Will need to refer to psychiatry. After review of records, would be managed better under specialized care.    Medications: Outpatient Medications Prior to Visit  Medication Sig   ALPRAZolam (XANAX) 1 MG tablet Take 1 mg by mouth 3 (three) times daily as needed for anxiety.   sertraline (ZOLOFT) 100 MG tablet Take 200 mg by mouth daily.   No facility-administered medications prior to visit.    Review of Systems  {Labs (Optional):23779}   Objective    There were no vitals taken for this visit. BP Readings from Last 3 Encounters:  04/24/22 106/60  02/04/18 (!) 140/97  01/05/16 112/70    Wt Readings from Last 3 Encounters:  04/24/22 140 lb 1.9 oz (63.6 kg)  01/05/16 127 lb 12.8 oz (58 kg)  12/29/13 127 lb (57.6 kg)    Physical Exam  ***  No results found for any visits on 06/05/22.  Assessment & Plan     Problem List Items Addressed This Visit   None    No follow-ups on file.         Carlean Jews, NP  [facility] at [facility] [phone] (phone) [phone] (fax)  [facility]

## 2022-06-05 NOTE — Progress Notes (Signed)
Established patient visit   Patient: Gary Mason   DOB: 10-01-1986   36 y.o. Male  MRN: [id number] Visit Date: 06/05/2022   Chief Complaint  Patient presents with   Follow-up   Subjective    HPI  Annual physical  -treated for severe anxiety. Will need to refer to psychiatry or seek a different provider who would prescribe medications for him which would effectively treat his anxiety. After review of records, would be managed better under specialized care.  -discussed this with the patient. I gave him back medical records which he brought with him to his previous visit.  -he does have a specific fear of driving at night. His job is asking that he work nighttime hours later on July. He does ask that I write a letter which states he not have to work during non daylight hours.     Medications: Outpatient Medications Prior to Visit  Medication Sig   sertraline (ZOLOFT) 100 MG tablet Take 200 mg by mouth daily.   [DISCONTINUED] ALPRAZolam (XANAX) 1 MG tablet Take 1 mg by mouth 3 (three) times daily as needed for anxiety.   No facility-administered medications prior to visit.    Review of Systems  Constitutional:  Negative for activity change, chills, fatigue and fever.  HENT:  Negative for congestion, postnasal drip, rhinorrhea, sinus pressure, sinus pain, sneezing and sore throat.   Eyes: Negative.   Respiratory:  Negative for cough, shortness of breath and wheezing.   Cardiovascular:  Negative for chest pain and palpitations.  Gastrointestinal:  Negative for constipation, diarrhea, nausea and vomiting.  Endocrine: Negative for cold intolerance, heat intolerance, polydipsia and polyuria.  Genitourinary:  Negative for dysuria, frequency and urgency.  Musculoskeletal:  Negative for back pain and myalgias.  Skin:  Negative for rash.  Allergic/Immunologic: Negative for environmental allergies.  Neurological:  Negative for dizziness, weakness and headaches.  Psychiatric/Behavioral:   Positive for dysphoric mood. The patient is nervous/anxious.        Objective     Today's Vitals   06/05/22 1428  BP: 117/76  Pulse: 74  Temp: 98.6 F (37 C)  SpO2: 98%  Weight: 140 lb 6.4 oz (63.7 kg)  Height: 5' 10.47" (1.79 m)   Body mass index is 19.88 kg/m.   BP Readings from Last 3 Encounters:  06/05/22 117/76  04/24/22 106/60  02/04/18 (!) 140/97    Wt Readings from Last 3 Encounters:  06/05/22 140 lb 6.4 oz (63.7 kg)  04/24/22 140 lb 1.9 oz (63.6 kg)  01/05/16 127 lb 12.8 oz (58 kg)      Assessment & Plan    1. Other situational type phobia The patient was given a letter recommending that he work during daylight hours only. His employer is asking that he work on July 21, 22, and 23, late, nighttime hours. This is causing him severe anxiety. Letter states that due to severe anxiety of driving at night, that patient be alowed to limit his working hours to daylight hours only, especially during those days in July.   2. Generalized anxiety disorder A 14 day supply of his current prescription for alprazolam 1mg  tablets was sent to his pharmacy. He was notified, during his visit, that no further prescriptions for benzodiazepines would be given to him from our office. He would need to find another provider to write this prescription for him. I did tell him I would be happy to continue caring for him. Patient is self-pay and states that he needs a provider  who can can for physical and mental health. I did give him back his medical records which he brought to me during his first visit. He voiced understanding of the circumstances. All questions were answered.  - ALPRAZolam (XANAX) 1 MG tablet; Take 1 tablet (1 mg total) by mouth 3 (three) times daily as needed for up to 14 days for anxiety.  Dispense: 40 tablet; Refill: 0   Problem List Items Addressed This Visit       Other   Generalized anxiety disorder   Relevant Medications   ALPRAZolam (XANAX) 1 MG tablet   Other  situational type phobia - Primary   Relevant Medications   ALPRAZolam (XANAX) 1 MG tablet     No follow-ups on file.         Carlean Jews, NP  [facility] at [facility] [phone] (phone) [phone] (fax)  [facility]

## 2022-06-23 ENCOUNTER — Telehealth: Payer: Self-pay | Admitting: Nurse Practitioner

## 2022-06-23 NOTE — Telephone Encounter (Signed)
Called Pt he stated that he will addressed the issue with his boss

## 2022-06-23 NOTE — Telephone Encounter (Signed)
I gave this guy a letter asking that he work daylight hours only. I'm not sure his boss can ask questions about his medical history. This is basically what was said in the letter -  The patient was given a letter recommending that he work during daylight hours only. His employer is asking that he work on July 21, 22, and 23, late, nighttime hours. This is causing him severe anxiety. Letter states that due to severe anxiety of driving at night, that patient be alowed to limit his working hours to daylight hours only, especially during those days in July -is there a specific form that he is needing to be filled out?  -at his last visit, I only dd this letter and advised him that he would have to find a different provider due to the severe anxiety he had and the dose of controlled medications that he took. I'm not sure if he is still even our patient.  I don't see where he has established care anywhere else, so I would complete any needed forms.

## 2022-06-23 NOTE — Telephone Encounter (Signed)
Patient's boss called and is wanting to ask questions about a letter that was given to patient restricting him from working certain times due to his anxiety. Patient is requesting a medical exemption for an event coming up that he needs to present for. Can someone call Gary Mason back regarding this? [phone]
